# Patient Record
Sex: Female | Born: 1970 | Race: Black or African American | Hispanic: No | Smoking: Never smoker
Health system: Southern US, Community
[De-identification: ages and names within clinical notes are randomized; demographics above are authoritative.]

## PROBLEM LIST (undated history)

## (undated) DIAGNOSIS — E119 Type 2 diabetes mellitus without complications: Secondary | ICD-10-CM

## (undated) DIAGNOSIS — I10 Essential (primary) hypertension: Secondary | ICD-10-CM

## (undated) DIAGNOSIS — K219 Gastro-esophageal reflux disease without esophagitis: Secondary | ICD-10-CM

## (undated) DIAGNOSIS — E7800 Pure hypercholesterolemia, unspecified: Secondary | ICD-10-CM

## (undated) HISTORY — PX: CHOLECYSTECTOMY: SHX55

## (undated) HISTORY — PX: TUBAL LIGATION: SHX77

---

## 2016-03-30 ENCOUNTER — Ambulatory Visit (HOSPITAL_COMMUNITY)
Admission: EM | Admit: 2016-03-30 | Discharge: 2016-03-30 | Disposition: A | Discharge status: Home / Self Care | Payer: Self-pay

## 2016-03-30 DIAGNOSIS — J0140 Acute pansinusitis, unspecified: Secondary | ICD-10-CM

## 2016-03-30 DIAGNOSIS — J4521 Mild intermittent asthma with (acute) exacerbation: Secondary | ICD-10-CM

## 2016-03-30 MED ORDER — PREDNISONE 10 MG (21) PO TBPK: ORAL_TABLET | ORAL | 0 refills | Status: DC

## 2016-03-30 MED ORDER — BENZONATATE 100 MG PO CAPS: 100.0000 mg | ORAL_CAPSULE | Freq: Three times a day (TID) | ORAL | 0 refills | Status: DC | PRN

## 2016-03-30 MED ORDER — AMOXICILLIN-POT CLAVULANATE 875-125 MG PO TABS: 1.0000 | ORAL_TABLET | Freq: Two times a day (BID) | ORAL | 0 refills | Status: AC

## 2016-03-30 NOTE — ED Provider Notes (Signed)
CSN: [id number]     Arrival date & time 03/30/16  1003 History   First MD Initiated Contact with Patient 03/30/16 1049     Chief Complaint  Patient presents with  . URI   (Consider location/radiation/quality/duration/timing/severity/associated sxs/prior Treatment) 45 year old female presents with nasal congestion, sore throat, intermittent low grade fever, chills, and cough for over 1 week. Denies any GI symptoms. Has history of recurrent sinus infections and asthma. Takes Advair daily and uses Albuterol inhaler as needed. Has been having difficulty sleeping the past 2 nights due to cough. Also is new to the area and also needs to become established with a PCP for management of her HTN and Diabetes.    The history is provided by the patient.  URI  Presenting symptoms: congestion, cough, facial pain, fatigue, fever, rhinorrhea and sore throat   Presenting symptoms: no ear pain   Severity:  Moderate Onset quality:  Sudden Duration:  1 week Timing:  Constant Progression:  Worsening Chronicity:  New Relieved by:  Nothing Ineffective treatments:  Inhaler, OTC medications and rest Associated symptoms: headaches, sinus pain and wheezing   Associated symptoms: no neck pain and no sneezing   Risk factors: diabetes mellitus     No past medical history on file. No past surgical history on file. No family history on file. Social History  Substance Use Topics  . Smoking status: Not on file  . Smokeless tobacco: Not on file  . Alcohol use Not on file   OB History    No data available     Review of Systems  Constitutional: Positive for chills, fatigue and fever. Negative for appetite change.  HENT: Positive for congestion, postnasal drip, rhinorrhea, sinus pressure and sore throat. Negative for ear pain and sneezing.   Eyes: Negative for discharge.  Respiratory: Positive for cough, chest tightness, shortness of breath and wheezing.   Cardiovascular: Negative for chest pain.   Gastrointestinal: Negative for abdominal pain, diarrhea, nausea and vomiting.  Musculoskeletal: Negative for neck pain and neck stiffness.  Skin: Negative for rash.  Neurological: Positive for headaches. Negative for dizziness, weakness and numbness.    Allergies  Lisinopril-hydrochlorothiazide  Home Medications   Prior to Admission medications   Medication Sig Start Date End Date Taking? Authorizing Provider  Albuterol Sulfate (VENTOLIN HFA IN) Inhale into the lungs.   Yes Historical Provider, MD  GLIMEPIRIDE PO Take by mouth.   Yes Historical Provider, MD  METFORMIN HCL PO Take by mouth.   Yes Historical Provider, MD  METOPROLOL TARTRATE PO Take by mouth.   Yes Historical Provider, MD  Potassium Chloride (KLOR-CON PO) Take by mouth.   Yes Historical Provider, MD  amoxicillin-clavulanate (AUGMENTIN) 875-125 MG tablet Take 1 tablet by mouth every 12 (twelve) hours. 03/30/16 04/06/16  Sudie Grumbling, NP  benzonatate (TESSALON) 100 MG capsule Take 1 capsule (100 mg total) by mouth 3 (three) times daily as needed for cough. 03/30/16   Sudie Grumbling, NP  predniSONE (STERAPRED UNI-PAK 21 TAB) 10 MG (21) TBPK tablet Take 6 tabs by mouth 1st day then decrease by 1 tablet daily until finished on 6th day. 03/30/16   Sudie Grumbling, NP   Meds Ordered and Administered this Visit  Medications - No data to display  BP (!) 130/102 (BP Location: Left Arm)   Pulse 101   Temp 98.3 F (36.8 C) (Oral)   Resp 16   LMP 03/29/2016   SpO2 100%  No data found.   Physical  Exam  Constitutional: She is oriented to person, place, and time. She appears well-developed and well-nourished. No distress.  HENT:  Head: Normocephalic and atraumatic.  Right Ear: Hearing, tympanic membrane, external ear and ear canal normal.  Left Ear: Hearing, tympanic membrane, external ear and ear canal normal.  Nose: Mucosal edema and rhinorrhea present. Right sinus exhibits maxillary sinus tenderness and frontal sinus  tenderness. Left sinus exhibits maxillary sinus tenderness and frontal sinus tenderness.  Mouth/Throat: Uvula is midline and mucous membranes are normal. Posterior oropharyngeal erythema present.  Neck: Normal range of motion. Neck supple.  Cardiovascular: Normal rate, regular rhythm and normal heart sounds.   Pulmonary/Chest: Effort normal. She has decreased breath sounds in the right upper field, the right lower field, the left upper field and the left lower field. She has wheezes in the right upper field and the left upper field. She has no rhonchi. She has no rales.  Lymphadenopathy:    She has no cervical adenopathy (but tender).  Neurological: She is alert and oriented to person, place, and time.  Skin: Skin is warm and dry. Capillary refill takes less than 2 seconds.  Psychiatric: She has a normal mood and affect. Her behavior is normal. Judgment and thought content normal.    Urgent Care Course   Clinical Course    Procedures (including critical care time)  Labs Review Labs Reviewed - No data to display  Imaging Review No results found.   Visual Acuity Review  Right Eye Distance:   Left Eye Distance:   Bilateral Distance:    Right Eye Near:   Left Eye Near:    Bilateral Near:         MDM   1. Acute pansinusitis, recurrence not specified   2. Asthma in adult, mild intermittent, with acute exacerbation    Recommend Augmentin 875mg  twice a day as directed. Use Tessalon perles every 8 hours as needed for cough. May start Prednisone 10mg  6 day dose pack as directed. Continue to use Advair as directed and use Albuterol 2 puffs every 6 hours as needed. Increase fluid intake. Phone number provided to call for listing of local PCP's in the area so she can establish care for continue management of HTN, Diabetes and possible arthritis. Recommend follow-up if current symptoms are not improving within 4 to 5 days and with a PCP as planned.     Sudie GrumblingAnn Berry Skyra Crichlow, NP 03/30/16  1230

## 2016-03-30 NOTE — ED Triage Notes (Signed)
States she is in a new area Is having dry cough, sob, hot and cold chills Used neb as tx

## 2016-03-30 NOTE — Discharge Instructions (Signed)
Start Augmentin twice a day as directed. May take Tessalon perles 3 times a day as needed for cough. Take Prednisone 10mg  6 tablets today and decrease by 1 tablet daily until finished. Use Albuterol inhaler and Advair as directed. Follow-up if symptoms do not improve within 4 to 5 days.

## 2016-04-14 ENCOUNTER — Ambulatory Visit (INDEPENDENT_AMBULATORY_CARE_PROVIDER_SITE_OTHER): Payer: Self-pay

## 2016-04-14 ENCOUNTER — Ambulatory Visit (HOSPITAL_COMMUNITY)
Admission: EM | Admit: 2016-04-14 | Discharge: 2016-04-14 | Disposition: A | Discharge status: Home / Self Care | Payer: Self-pay | Attending: Internal Medicine | Admitting: Internal Medicine

## 2016-04-14 ENCOUNTER — Encounter (HOSPITAL_COMMUNITY): Payer: Self-pay | Admitting: Family Medicine

## 2016-04-14 DIAGNOSIS — R0789 Other chest pain: Secondary | ICD-10-CM

## 2016-04-14 DIAGNOSIS — R059 Cough, unspecified: Secondary | ICD-10-CM

## 2016-04-14 DIAGNOSIS — S46911A Strain of unspecified muscle, fascia and tendon at shoulder and upper arm level, right arm, initial encounter: Secondary | ICD-10-CM

## 2016-04-14 DIAGNOSIS — J4520 Mild intermittent asthma, uncomplicated: Secondary | ICD-10-CM

## 2016-04-14 DIAGNOSIS — R05 Cough: Secondary | ICD-10-CM

## 2016-04-14 HISTORY — DX: Gastro-esophageal reflux disease without esophagitis: K21.9

## 2016-04-14 HISTORY — DX: Pure hypercholesterolemia, unspecified: E78.00

## 2016-04-14 HISTORY — DX: Essential (primary) hypertension: I10

## 2016-04-14 HISTORY — DX: Type 2 diabetes mellitus without complications: E11.9

## 2016-04-14 MED ORDER — CYCLOBENZAPRINE HCL 10 MG PO TABS: 10.0000 mg | ORAL_TABLET | Freq: Three times a day (TID) | ORAL | 0 refills | Status: AC | PRN

## 2016-04-14 MED ORDER — CYCLOBENZAPRINE HCL 10 MG PO TABS: 10.0000 mg | ORAL_TABLET | Freq: Three times a day (TID) | ORAL | 0 refills | Status: DC | PRN

## 2016-04-14 MED ORDER — METHYLPREDNISOLONE ACETATE 80 MG/ML IJ SUSP
INTRAMUSCULAR | Status: AC
  Filled 2016-04-14: qty 1

## 2016-04-14 MED ORDER — METHYLPREDNISOLONE ACETATE 80 MG/ML IJ SUSP
80.0000 mg | Freq: Once | INTRAMUSCULAR | Status: AC
  Administered 2016-04-14: 80 mg via INTRAMUSCULAR

## 2016-04-14 NOTE — ED Triage Notes (Signed)
Pt here for right shoulder pain, neck pain with movement. sts has been going on for a while but getting worse. sts that she can hear cracking in right shoulder. sts also some right rib pain. sts she has ben sick recently and coughing.

## 2016-04-14 NOTE — ED Provider Notes (Signed)
CSN: [id number]     Arrival date & time 04/14/16  1053 History   First MD Initiated Contact with Patient 04/14/16 1213     Chief Complaint  Patient presents with  . Neck Pain  . Shoulder Pain   (Consider location/radiation/quality/duration/timing/severity/associated sxs/prior Treatment) 45 yo patient with a history of Type 2 DM and HTN presents with right shoulder, upper chest pain. She was evaluated a few weeks ago for a respiratory infection. She continues to have a cough and slight wheeze. She is asthmatic as well. Over the last 3-4 weeks she has noticed tenderness in the right upper chest and shoulder girdle. Worse with cough and with ROM. Some right rib pain as well. No fevers or chills. Still noted fatigue.       Past Medical History:  Diagnosis Date  . Acid reflux   . Diabetes mellitus without complication (HCC)   . High cholesterol   . Hypertension    Past Surgical History:  Procedure Laterality Date  . CHOLECYSTECTOMY    . TUBAL LIGATION     History reviewed. No pertinent family history. Social History  Substance Use Topics  . Smoking status: Never Smoker  . Smokeless tobacco: Never Used  . Alcohol use Not on file   OB History    No data available     Review of Systems  Constitutional: Positive for fatigue. Negative for fever.  Respiratory: Positive for cough and wheezing. Negative for shortness of breath.   Musculoskeletal: Positive for arthralgias.    Allergies  Lisinopril-hydrochlorothiazide and Losartan  Home Medications   Prior to Admission medications   Medication Sig Start Date End Date Taking? Authorizing Provider  Albuterol Sulfate (VENTOLIN HFA IN) Inhale into the lungs.    Historical Provider, MD  benzonatate (TESSALON) 100 MG capsule Take 1 capsule (100 mg total) by mouth 3 (three) times daily as needed for cough. 03/30/16   Sudie Grumbling, NP  cyclobenzaprine (FLEXERIL) 10 MG tablet Take 1 tablet (10 mg total) by mouth 3 (three) times daily  as needed for muscle spasms. 04/14/16   Riki Sheer, PA-C  GLIMEPIRIDE PO Take by mouth.    Historical Provider, MD  METFORMIN HCL PO Take by mouth.    Historical Provider, MD  METOPROLOL TARTRATE PO Take by mouth.    Historical Provider, MD  Potassium Chloride (KLOR-CON PO) Take by mouth.    Historical Provider, MD   Meds Ordered and Administered this Visit   Medications  methylPREDNISolone acetate (DEPO-MEDROL) injection 80 mg (80 mg Intramuscular Given 04/14/16 1330)    BP 158/89   Pulse 89   Temp 98 F (36.7 C)   Resp 18   LMP 03/29/2016   SpO2 100%  No data found.   Physical Exam  Constitutional: She is oriented to person, place, and time. She appears well-developed and well-nourished. No distress.  HENT:  Head: Normocephalic and atraumatic.  Neck: Normal range of motion.  Cardiovascular: Normal rate and regular rhythm.   Pulmonary/Chest: Effort normal. She has wheezes.  Crackles and wheeze in the right lower base, mild improvement with cough  Musculoskeletal: She exhibits tenderness. She exhibits no edema or deformity.  Tenderness along the right upper shoulder region and anterior chest to palpation. No swelling. Painful but full ROM in the right shoulder, positive impingement, tenderness along the right above diaphragm ribs  Neurological: She is alert and oriented to person, place, and time.  Skin: Skin is warm and dry. No rash noted. She is not  diaphoretic.  Psychiatric: Her behavior is normal.    Urgent Care Course   Clinical Course    Procedures (including critical care time)  Labs Review Labs Reviewed - No data to display  Imaging Review Dg Chest 2 View  Result Date: 04/14/2016 CLINICAL DATA:  Right shoulder pain.  History of chronic asthma. EXAM: CHEST  2 VIEW COMPARISON:  None. FINDINGS: Normal cardiac silhouette and mediastinal contours. There is minimal pleural parenchymal thickening about the bilateral major fissures. No focal airspace  opacities. No pleural effusion or pneumothorax. No evidence of edema. No acute osseus abnormalities. IMPRESSION: No acute cardiopulmonary disease. No definite explanation for patient's right shoulder pain. Further evaluation with dedicated radiographs of the right shoulder could be performed as indicated. Electronically Signed   By: Simonne ComeJohn  Watts M.D.   On: 04/14/2016 13:03     Visual Acuity Review  Right Eye Distance:   Left Eye Distance:   Bilateral Distance:    Right Eye Near:   Left Eye Near:    Bilateral Near:         MDM   1. Cough   2. Strain of right shoulder, initial encounter   3. Chest wall pain   4. Mild intermittent asthma, unspecified whether complicated    Cough in the setting of asthma and recent respiratory infection-Treated adequately with signs of ongoing infection. Treat with Depomedrol IM for inflammation and continue use of anti-histamine. F/U if worsens.  Strain to right shoulder and upper chest wall in the setting of ongoing cough. CXR normal. Treat with prn Flexeril and topicals if needed. FU prn    Riki SheerMichelle G Leonda Cristo, PA-C 04/14/16 1335

## 2016-04-14 NOTE — Discharge Instructions (Signed)
The cough does not appear infectious at this point with a normal CXR. Right shoulder and muscular chest pain is most likely from ongoing cough and strain. Treat with DepoMedrol injection with close monitoring of sugars. Flexeril is a muscle relaxer and can help with discomfort. If shoulder pain worsens suggest f/u with Orthopedics.

## 2016-04-22 ENCOUNTER — Encounter (HOSPITAL_COMMUNITY): Payer: Self-pay | Admitting: *Deleted

## 2016-04-22 ENCOUNTER — Inpatient Hospital Stay (HOSPITAL_COMMUNITY)
Admission: AD | Admit: 2016-04-22 | Discharge: 2016-04-22 | Disposition: A | Discharge status: Home / Self Care | Payer: Self-pay | Source: Ambulatory Visit | Attending: Obstetrics and Gynecology | Admitting: Obstetrics and Gynecology

## 2016-04-22 ENCOUNTER — Other Ambulatory Visit: Payer: Self-pay | Admitting: Advanced Practice Midwife

## 2016-04-22 DIAGNOSIS — R102 Pelvic and perineal pain: Secondary | ICD-10-CM

## 2016-04-22 DIAGNOSIS — E119 Type 2 diabetes mellitus without complications: Secondary | ICD-10-CM | POA: Insufficient documentation

## 2016-04-22 DIAGNOSIS — A5901 Trichomonal vulvovaginitis: Secondary | ICD-10-CM

## 2016-04-22 DIAGNOSIS — Z3202 Encounter for pregnancy test, result negative: Secondary | ICD-10-CM | POA: Insufficient documentation

## 2016-04-22 DIAGNOSIS — N939 Abnormal uterine and vaginal bleeding, unspecified: Secondary | ICD-10-CM | POA: Insufficient documentation

## 2016-04-22 DIAGNOSIS — I10 Essential (primary) hypertension: Secondary | ICD-10-CM | POA: Insufficient documentation

## 2016-04-22 DIAGNOSIS — N926 Irregular menstruation, unspecified: Secondary | ICD-10-CM

## 2016-04-22 LAB — URINALYSIS, ROUTINE W REFLEX MICROSCOPIC
Bilirubin Urine: NEGATIVE
GLUCOSE, UA: NEGATIVE mg/dL
Ketones, ur: NEGATIVE mg/dL
Nitrite: NEGATIVE
PROTEIN: 30 mg/dL — AB
pH: 5 (ref 5.0–8.0)

## 2016-04-22 LAB — URINE MICROSCOPIC-ADD ON

## 2016-04-22 LAB — WET PREP, GENITAL
CLUE CELLS WET PREP: NONE SEEN
Sperm: NONE SEEN
Yeast Wet Prep HPF POC: NONE SEEN

## 2016-04-22 LAB — POCT PREGNANCY, URINE: PREG TEST UR: NEGATIVE

## 2016-04-22 MED ORDER — METRONIDAZOLE 500 MG PO TABS: ORAL_TABLET | ORAL | 0 refills | Status: AC

## 2016-04-22 NOTE — Discharge Instructions (Signed)
Abnormal Uterine Bleeding Abnormal uterine bleeding can affect women at various stages in life, including teenagers, women in their reproductive years, pregnant women, and women who have reached menopause. Several kinds of uterine bleeding are considered abnormal, including:  Bleeding or spotting between periods.   Bleeding after sexual intercourse.   Bleeding that is heavier or more than normal.   Periods that last longer than usual.  Bleeding after menopause.  Many cases of abnormal uterine bleeding are minor and simple to treat, while others are more serious. Any type of abnormal bleeding should be evaluated by your health care provider. Treatment will depend on the cause of the bleeding. HOME CARE INSTRUCTIONS Monitor your condition for any changes. The following actions may help to alleviate any discomfort you are experiencing:  Avoid the use of tampons and douches as directed by your health care provider.  Change your pads frequently. You should get regular pelvic exams and Pap tests. Keep all follow-up appointments for diagnostic tests as directed by your health care provider.  SEEK MEDICAL CARE IF:   Your bleeding lasts more than 1 week.   You feel dizzy at times.  SEEK IMMEDIATE MEDICAL CARE IF:   You pass out.   You are changing pads every 15 to 30 minutes.   You have abdominal pain.  You have a fever.   You become sweaty or weak.   You are passing large blood clots from the vagina.   You start to feel nauseous and vomit. MAKE SURE YOU:   Understand these instructions.  Will watch your condition.  Will get help right away if you are not doing well or get worse.   This information is not intended to replace advice given to you by your health care provider. Make sure you discuss any questions you have with your health care provider.   Document Released: 06/05/2005 Document Revised: 06/10/2013 Document Reviewed: 01/02/2013 Elsevier Interactive  Patient Education 2016 [employer]  [facility] Resource Guide (Revised August 2014)   Chronic Pain Problems:   [facility]:  [phone]           Patients need to be referred by their primary care doctor/specialist  Insufficient Money for Medicine:           United Way: call "211"    MAP Program at [facility] - [place] [phone] or [place] [phone]            No Primary Care Doctor:  To locate a primary care doctor that accepts your insurance or provides certain services:           [facility]: [phone]           Physician Referral Service: [phone] ask for My [place]  If no insurance, you need to see if you qualify for [facility] orange card, call to set      up appointment for eligibility/enrollment at [phone] or [phone] or visit [facility] and [employer] ([address], [place] and [address] -[place]) to meet with a [facility] enrollment specialist.  Agencies that provide inexpensive (sliding fee scale) medical care:       [facility] - Family Medicine at [place] - [phone]     [facility]  -  [facility] [phone]     [facility] - [phone]     [facility] - [phone]  [facility] [phone]     [facility] Family [facility] [phone]  [facility] - [facility] Child [facility] @ [place] - [phone]     [phone]  [facility] - [facility] @ [place] - [phone]  [facility] Family Practice: [phone]   Women's Clinic: [phone]   Planned Parenthood: [phone]   [facility] the [place]    Medicaid-accepting [facility] Providers:           [facility] [phone] (No Family Planning accepted)          [address], [address], [phone], Mon-Fri 9am-5pm          [facility] - [phone]  [address] [place], Suite [place], [place] 8am-5pm, Fri 8am-noon  [employer] - [phone]          [address], Mon-Fri 7:30am-4:30pm          [facility] - [phone]          [address] [place] 8am-5pm          [facility] - [phone] [address], [address]          Only accepts [place] [employer] patients after they have their name applied to their card  Self Pay (no insurance) in [facility]:           Sickle Cell Patients:   [address] [place], [phone] [facility] Internal Medicine:  [address] [place] [phone]       [facility]  [address], [place] [phone]  [facility] Family Practice:  [address], [phone]          [facility] Urgent Care           [address] [place], [phone] [facility]  [address] [place], [phone]           [facility] [place]           [address], [address], [place] [phone]        [facility] - [address]           [phone], Mon-Fri 9am-7pm, [place] 9am-1pm          [facility] - Family Medicine @ [facility]          [address] [place], [phone]          [facility] - [facility]           [address] [phone] [facility] - [facility]  [address], [place] [phone]          [facility]           [address], [phone]  [facility] - [facility] Health   [address] [place], [phone] [facility] - [facility] Health  [address], [phone]  Dr. Julio Sicks            [address],  [place], [phone]          [facility]gent Care           [address] [phone]          [facility]             [address], [phone]          [facility]           [address] [place], [phone], 1st & 3rd Saturday every month, 10am-1pm  OTHERS:  [employer]  [employer] Only)  [phone] (Thursday only)  Strategies for finding a Primary Care Provider:  1) Find a Doctor and Pay Out of Pocket  Although you won't have to find out who is covered by your insurance plan, it is a good idea to ask around and get recommendations. You will then need to call the office and see if the doctor you have chosen will accept you as a new patient and what types of options they offer for patients who are self-pay. Some doctors offer discounts or will set up payment plans for their patients who do not have insurance, but you will need to ask so you aren't surprised when you get to your appointment.  2) Contact [place] [employer] - To see if you qualify for orange card access to healthcare safety net providers.  Call for appointment for eligibility/enrollment at [phone] or [phone]. (Uninsured, 0-200% FPL, qualifying info)  Applicants for [facility] are first required to see if they are eligible to enroll in the [facility] Marketplace before enrolling in [facility] (and get an exemption if they are not).  GCCN Criteria for acceptance is:  ? Proof of ACA Marketing exemption - form or documentation  ? Valid photo ID (driver's license, state identification card, passport, home country ID)  ? Proof of [facility] residency (e.g. drivers license, lease/landlord information, pay stubs with address, utility bill, bank statement, etc.)  ? Proof of income (1040, last year's tax return, W2, 4 current pay stubs, other income proof)  ? Proof of assets (current bank statement + 3 most recent, disability paperwork, life  insurance info, tax value on autos, etc.)  3) Contact Your Local Health Department  Not all health departments have doctors that can see patients for sick visits, but many do, so it is worth a call to see if yours does. If you don't know where your local health department is, you can check in your phone book. The CDC also has a tool to help you locate your state's health department, and many state websites also have listings of all of their local health departments.  4) Find a Walk-in Clinic  If your illness is not likely to be very severe or complicated, you may want to try a walk in clinic. These are popping up all over the country in pharmacies, drugstores, and shopping centers. They're usually staffed by nurse practitioners or physician assistants that have been trained to treat common illnesses and complaints. They're usually fairly quick and inexpensive. However, if you have serious medical issues or chronic medical problems, these are probably not your best option   STD Testing:           [facility] Department of [facility] [place], [place] [facility]           [address], [place], phone [phone] or [phone]           Monday -  Friday, call for an appointment          [facility] Department of [facility], [place] [facility]           [address], [place], phone [phone] or [phone]           Monday - Friday, call for an appointment Abuse/Neglect:           [facility] Child Abuse Hotline: 986-195-2137           [facility] Child Abuse Hotline: (234) 403-1932 (After Hours)  Emergency Shelter:  [facility] [phone]  [facility] [place]- [phone]  [facility] [place] - [phone]  Youth Focus - Act Together - [phone] (ages 71-17)  Homeless Day Shelter @ [employer] - [phone]   Mammograms - Free at [facility] [phone]  Maternity Homes:           Room at the [place] of  the [facility]: [phone]   (Homeless mother with children)          [facility] Alert Services: [phone] (Mothers only)  Youth Focus: [phone] (Pregnant 96-30 years old)  Adopt a Mom [phone]  [facility]   [facility] - Lanae Boast  [address] [place] [phone]          [facility]           [address]           [phone]          [place]           [address] [place], [place]           [phone]          [facility] Dept.           [address], [place]           [phone]          [facility] Mental Health           [phone]          [facility]           [phone]          [facility] in [place]           [address]           7313522539, [facility] Child Abuse Hotline           430-320-2862           [phone] (After Hours)  Behavioral Health Services /Substance Abuse Resources:           Alcohol and Drug Services: [phone]           [facility]: [phone]          [facility]: [phone]   Narcotics Helpline - [phone]          [facility]: (316)359-4236           Residential & Outpatient Substance Abuse Program - [facility] [place]: [phone]  [employer]  [facility]al [facility]ness [facility] - [phone] Psychological Services:          [facility]: [phone]   Therapeutic  Alternatives: [phone]          [facility]           [address], [place]           ACCESS LINE: [phone]     (24 Hour)  Mobile Crisis:   HELPLINES:  Financial risk analystational Alliance on Mental Illness - [place] [phone] [facility] Illness - [place] [phone]) [phone]  [facility] In [facility] Services       [facility] -  [address] - [place]  [phone]       [facility] - [phone] or [phone]) [phone]  [facility] - [phone] S. [address] - [place] [phone]  [facility] [phone] [address], [place] [phone]   Dental Assistance:  If unable to pay or uninsured, contact: [facility] to become qualified for the adult dental clinic. Patient must be enrolled in [facility] (uninsured, 0-200% FPL, qualifying info).  Enroll in [facility] first, then see Primary Care Physician assigned to you, the PCP makes a dental referral. [place] [facility] will receive referral and contacts patient for appointment.  Patients with Medicaid           [address], [phone]  [facility] (Children up to 20 + Pregnant Women) - [phone]  [facility] - [address] - [address] [phone]  If unable to pay, or uninsured: contact [facility] [phone] in [place] - (Children only + Pregnant Women), [phone] in [facility]- Children only) to become qualified for the adult dental clinic  Must see if eligible to enroll in [facility] Health Insurance Marketplace before enrolling into the [facility] (exemption required) [phone] for an appointment)  [email or web address];   [phone].  If not eligible for ACA, then go by Department of Health and Human Services to see if eligible for orange card.  [address], [place] and [phone]- [address].  Once you get an orange card, you will have a Primary Care home who will then refer you to dental if needed.        Other IT consultantLow-Cost Community Dental Services:   [facility] [phone]- [phone] (ext [phone])   [address]  Dr. Lawrence Marseillesivils - [phone]   [address]    [employer] [employer] - [phone]   [address] [facility] [address]           [facility]           [address], [place], [place], [phone]           [phone], Ext. [phone]            2nd and 4th Thursday of the month at 6:30am (Simple extractions only - no wisdom teeth or surgery) First come/First serve -First 10 clients served           [facility] [place], [place] and [place] residents only)          [address], [place], [place], [address]27[address]           [phone]                    [place] [facility]           [phone]          [facility]  [phone]         [facility] Department - [facility]          [phone]   Transportation Options:  Ambulance - 911 - $[phone] per ride Family Member to accompany patient (if stable) [employer] - [phone]  PART - [phone]  Taxi - [phone] - [place]  SCAT - [phone] (Application required)  [facility] - [phone]

## 2016-04-22 NOTE — MAU Note (Signed)
Menstrual came on, noted something in the toilet after using the restroom, thought it was toilet tissue at first, but it wasn't.  Kind of freaked her out. So she brought it in.

## 2016-04-22 NOTE — MAU Provider Note (Signed)
Chief Complaint: something fell out of vagina   None     SUBJECTIVE HPI: Kristin Hogan is a 45 y.o. [phone] who presents to maternity admissions reporting she was using the bathroom at home and a large piece of tissue came out and landed in the toilet. She got it out of the toilet and took pictures, describing at 4-5 inches long, stringy, pink.  She has never seen anything like this before. She reports she is on her menses, which are usually monthly, but vary a little from month to month in the last year or two. She reports some mild menstrual cramping, consistent with her usual periods today. She has not tried any treatments and came right to MAU because she was scared of what she saw in the toilet. She recently moved to [facility] and lost her insurance since she is separated from her husband.   She denies vaginal itching/burning, urinary symptoms, h/a, dizziness, n/v, or fever/chills.     HPI  Past Medical History:  Diagnosis Date  . Acid reflux   . Diabetes mellitus without complication (HCC)   . High cholesterol   . Hypertension    Past Surgical History:  Procedure Laterality Date  . CHOLECYSTECTOMY    . TUBAL LIGATION     Social History   Social History  . Marital status: Legally Separated    Spouse name: N/A  . Number of children: N/A  . Years of education: N/A   Occupational History  . Not on file.   Social History Main Topics  . Smoking status: Never Smoker  . Smokeless tobacco: Never Used  . Alcohol use Not on file  . Drug use: Unknown  . Sexual activity: Not on file   Other Topics Concern  . Not on file   Social History Narrative  . No narrative on file   No current facility-administered medications on file prior to encounter.    Current Outpatient Prescriptions on File Prior to Encounter  Medication Sig Dispense Refill  . cyclobenzaprine (FLEXERIL) 10 MG tablet Take 1 tablet (10 mg total) by mouth 3 (three) times daily as needed for muscle  spasms. 20 tablet 0   Allergies  Allergen Reactions  . Lisinopril Swelling, Other (See Comments) and Cough    Reaction:  Lip swelling  . Losartan Swelling, Other (See Comments) and Cough    Reaction:  Lip swelling    ROS:  Review of Systems  Constitutional: Negative for chills, fatigue and fever.  Respiratory: Negative for shortness of breath.   Cardiovascular: Negative for chest pain.  Gastrointestinal: Negative for diarrhea and vomiting.  Genitourinary: Positive for pelvic pain, vaginal bleeding and vaginal discharge. Negative for difficulty urinating, dysuria, flank pain and vaginal pain.  Neurological: Negative for dizziness and headaches.  Psychiatric/Behavioral: Negative.      I have reviewed patient's Past Medical Hx, Surgical Hx, Family Hx, Social Hx, medications and allergies.   Physical Exam  Patient Vitals for the past 24 hrs:  BP Temp Pulse Resp Weight  04/22/16 1505 130/70 - 103 - -  04/22/16 1404 149/87 98.2 F (36.8 C) 100 18 281 lb 3.2 oz (127.6 kg)   Constitutional: Well-developed, well-nourished female in no acute distress.  Cardiovascular: normal rate Respiratory: normal effort GI: Abd soft, non-tender. Pos BS x 4 MS: Extremities nontender, no edema, normal ROM Neurologic: Alert and oriented x 4.  GU: Neg CVAT.  PELVIC EXAM: Cervix pink, visually closed, without lesion, small amount dark red bleeding, vaginal walls and  external genitalia normal Bimanual exam: Cervix 0/long/high, firm, anterior, neg CMT, uterus nontender, nonenlarged, adnexa with tenderness on right, none on left, no enlargement or mass bilaterally    LAB RESULTS Results for orders placed or performed during the hospital encounter of 04/22/16 (from the past 24 hour(s))  Urinalysis, Routine w reflex microscopic (not at [facility])     Status: Abnormal   Collection Time: 04/22/16  2:05 PM  Result Value Ref Range   Color, Urine AMBER (A) YELLOW   APPearance HAZY (A) CLEAR   Specific  Gravity, Urine >1.030 (H) 1.005 - 1.030   pH 5.0 5.0 - 8.0   Glucose, UA NEGATIVE NEGATIVE mg/dL   Hgb urine dipstick LARGE (A) NEGATIVE   Bilirubin Urine NEGATIVE NEGATIVE   Ketones, ur NEGATIVE NEGATIVE mg/dL   Protein, ur 30 (A) NEGATIVE mg/dL   Nitrite NEGATIVE NEGATIVE   Leukocytes, UA TRACE (A) NEGATIVE  Urine microscopic-add on     Status: Abnormal   Collection Time: 04/22/16  2:05 PM  Result Value Ref Range   Squamous Epithelial / LPF 6-30 (A) NONE SEEN   WBC, UA 0-5 0 - 5 WBC/hpf   RBC / HPF TOO NUMEROUS TO COUNT 0 - 5 RBC/hpf   Bacteria, UA FEW (A) NONE SEEN   Urine-Other TRICHOMONAS PRESENT   Pregnancy, urine POC     Status: None   Collection Time: 04/22/16  2:15 PM  Result Value Ref Range   Preg Test, Ur NEGATIVE NEGATIVE  Wet prep, genital     Status: Abnormal   Collection Time: 04/22/16  2:30 PM  Result Value Ref Range   Yeast Wet Prep HPF POC NONE SEEN NONE SEEN   Trich, Wet Prep PRESENT (A) NONE SEEN   Clue Cells Wet Prep HPF POC NONE SEEN NONE SEEN   WBC, Wet Prep HPF POC FEW (A) NONE SEEN   Sperm NONE SEEN        IMAGING Dg Chest 2 View  Result Date: 04/14/2016 CLINICAL DATA:  Right shoulder pain.  History of chronic asthma. EXAM: CHEST  2 VIEW COMPARISON:  None. FINDINGS: Normal cardiac silhouette and mediastinal contours. There is minimal pleural parenchymal thickening about the bilateral major fissures. No focal airspace opacities. No pleural effusion or pneumothorax. No evidence of edema. No acute osseus abnormalities. IMPRESSION: No acute cardiopulmonary disease. No definite explanation for patient's right shoulder pain. Further evaluation with dedicated radiographs of the right shoulder could be performed as indicated. Electronically Signed   By: Simonne ComeJohn  Watts M.D.   On: 04/14/2016 13:03    MAU Management/MDM: Ordered labs while pt in MAU. Pt discharge c/w endometrial lining, likely normal and related to menses, especially with recent hx of irregular  periods, perimenopausal changes. No treatment necessary. Pt left MAU without results of wet prep.  Called pt at home, notified her of trichomonas diagnosis.  Pt given opportunity to ask questions.  She reports only 1 partner in last 5 years, her ex-husband.   Rx for Flagyl 2 g x 1 dose sent to pt pharmacy. Recommend partner treatment. GCC pending.   F/U in office for routine Gyn care, return to MAU for gyn emergencies. Pt stable at time of discharge  ASSESSMENT 1. Menstrual changes   2. Right adnexal tenderness   3. Abnormal uterine bleeding (AUB)     PLAN Discharge home   Medication List    TAKE these medications   albuterol 108 (90 Base) MCG/ACT inhaler Commonly known as:  PROVENTIL HFA;VENTOLIN HFA Inhale  1-2 puffs into the lungs every 6 (six) hours as needed for wheezing or shortness of breath.   amLODipine 10 MG tablet Commonly known as:  NORVASC Take 10 mg by mouth daily.   cyclobenzaprine 10 MG tablet Commonly known as:  FLEXERIL Take 1 tablet (10 mg total) by mouth 3 (three) times daily as needed for muscle spasms.   ferrous sulfate 325 (65 FE) MG tablet Take 325 mg by mouth daily with breakfast.   Fluticasone-Salmeterol 250-50 MCG/DOSE Aepb Commonly known as:  ADVAIR Inhale 1 puff into the lungs 2 (two) times daily.   glimepiride 2 MG tablet Commonly known as:  AMARYL Take 2 mg by mouth daily with breakfast.   hydrochlorothiazide 25 MG tablet Commonly known as:  HYDRODIURIL Take 25 mg by mouth daily.   metFORMIN 850 MG tablet Commonly known as:  GLUCOPHAGE Take 850 mg by mouth 2 (two) times daily with a meal.   metoprolol tartrate 25 MG tablet Commonly known as:  LOPRESSOR Take 25 mg by mouth 2 (two) times daily.   montelukast 10 MG tablet Commonly known as:  SINGULAIR Take 10 mg by mouth at bedtime.   ranitidine 150 MG tablet Commonly known as:  ZANTAC Take 150 mg by mouth daily.      Follow-up Information    [facility] Schedule an appointment as soon as possible for a visit today.   Specialty:  Obstetrics and Gynecology Contact information: [address] [place] [id number] [phone]          Sharen CounterLisa Leftwich-Kirby Certified Nurse-Midwife 04/22/2016  5:32 PM

## 2016-04-24 LAB — GC/CHLAMYDIA PROBE AMP (~~LOC~~) NOT AT ARMC
CHLAMYDIA, DNA PROBE: NEGATIVE
NEISSERIA GONORRHEA: NEGATIVE

## 2016-08-03 ENCOUNTER — Telehealth: Payer: Self-pay | Admitting: Advanced Practice Midwife

## 2018-06-12 IMAGING — DX DG CHEST 2V
2 series · 2 of 2 positions shown · non-contrast
Comparison: None.

CLINICAL DATA: Right shoulder pain.  History of chronic asthma.

EXAM:
CHEST  2 VIEW

[chest pa]
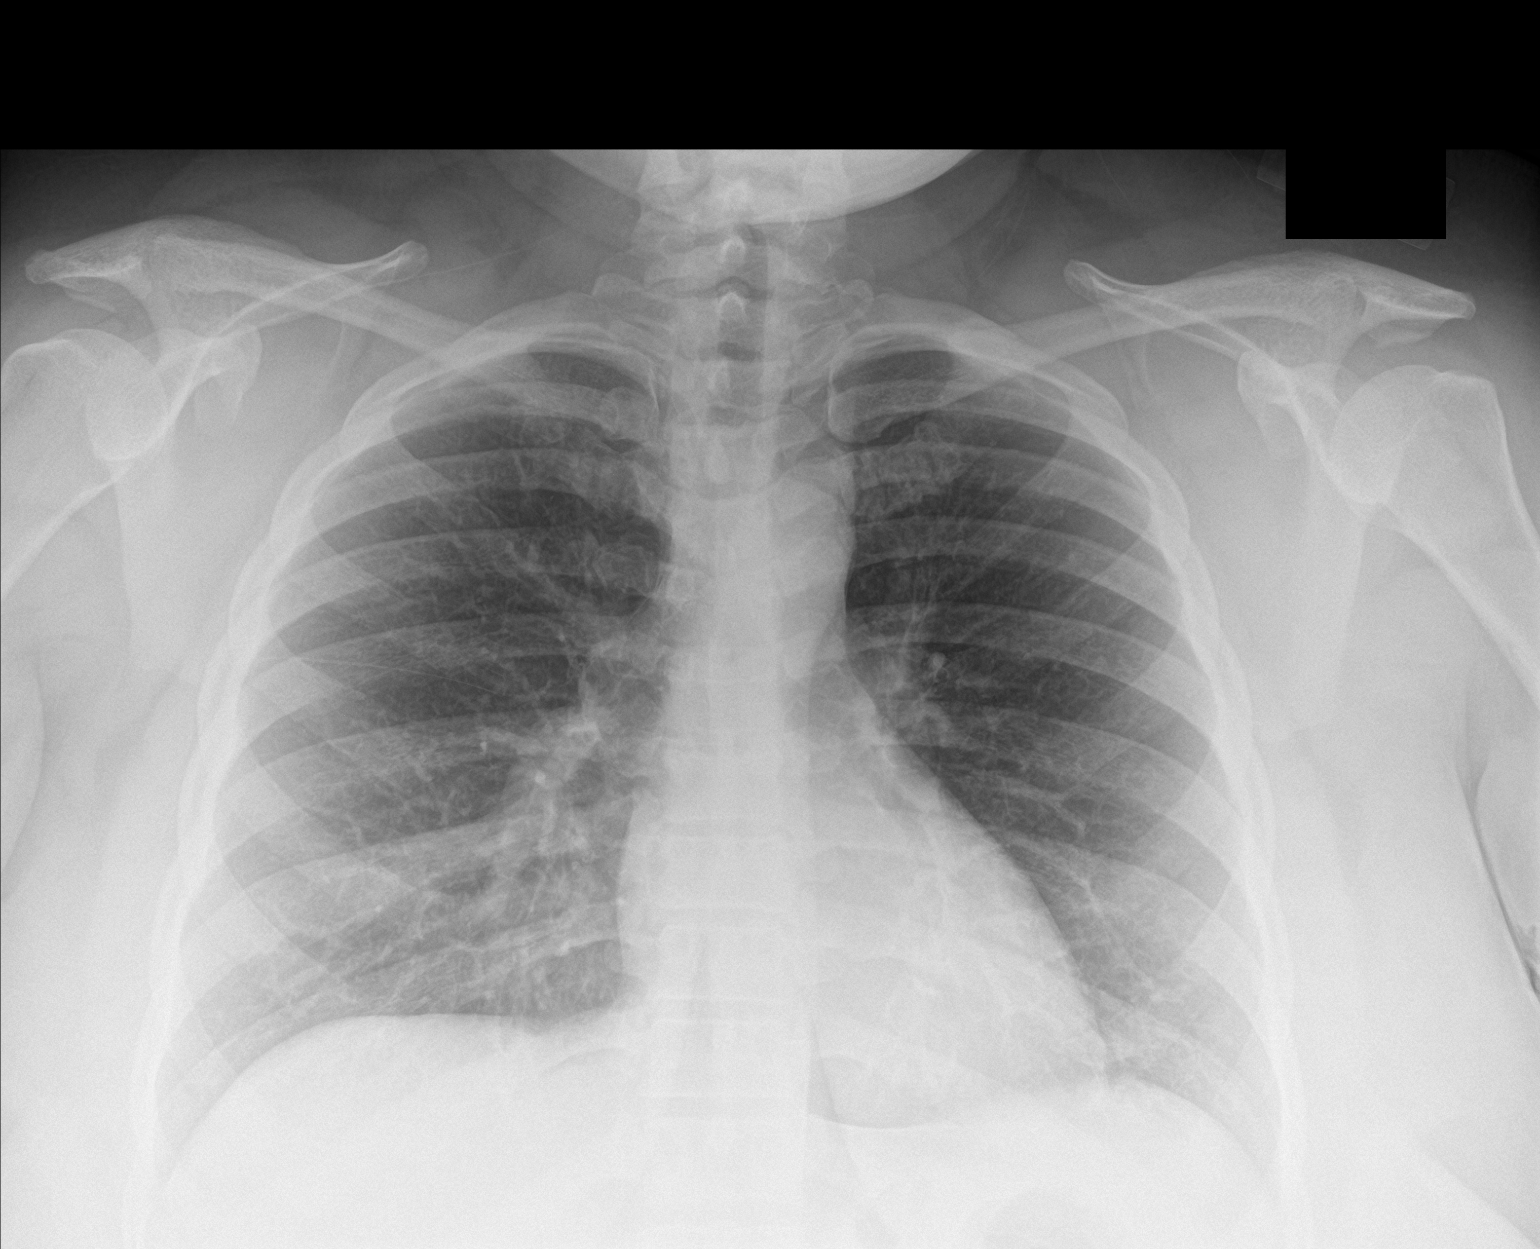

[chest lat]
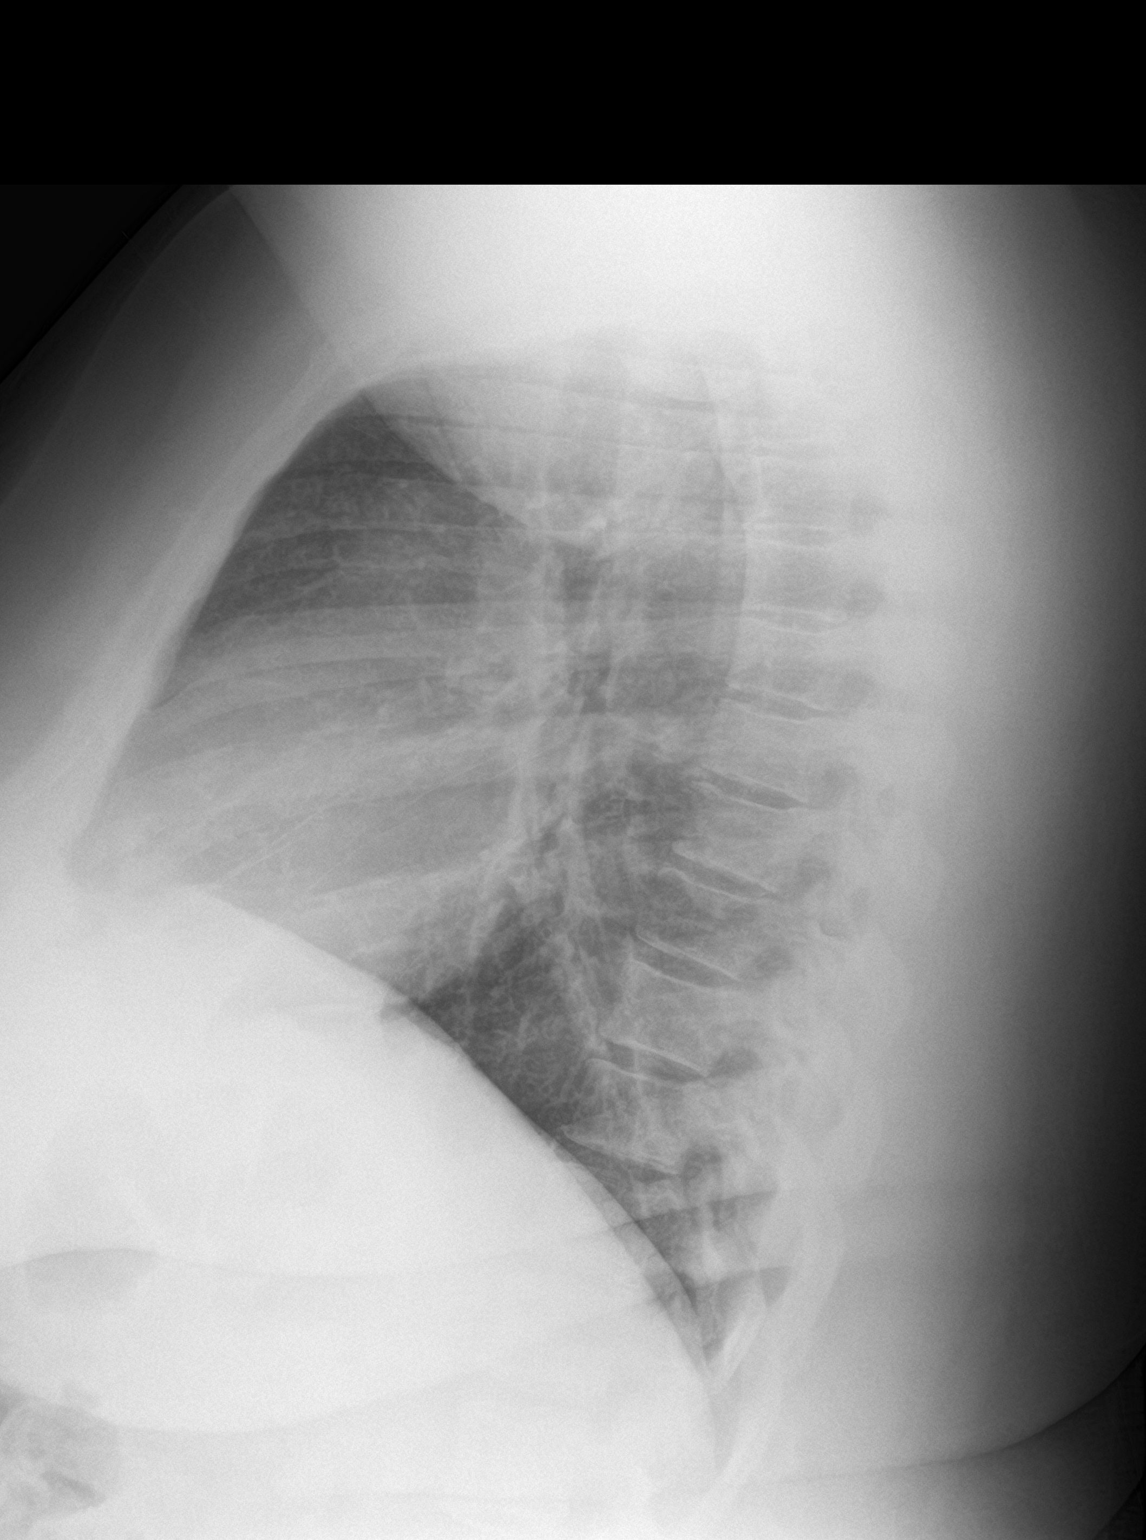

[2 of 2 positions shown; findings below may reference images not displayed]

FINDINGS: Normal cardiac silhouette and mediastinal contours. There is minimal
pleural parenchymal thickening about the bilateral major fissures.
No focal airspace opacities. No pleural effusion or pneumothorax. No
evidence of edema. No acute osseus abnormalities.
IMPRESSION: No acute cardiopulmonary disease. No definite explanation for
patient's right shoulder pain. Further evaluation with dedicated
radiographs of the right shoulder could be performed as indicated.
